# Patient Record
Sex: Male | Born: 2000 | Race: White | Hispanic: Yes | Marital: Single | Smoking: Never smoker
Health system: Southern US, Community
[De-identification: ages and names within clinical notes are randomized; demographics above are authoritative.]

---
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
## 2019-07-30 ENCOUNTER — Other Ambulatory Visit: Payer: Self-pay

## 2019-07-30 DIAGNOSIS — Z20822 Contact with and (suspected) exposure to covid-19: Secondary | ICD-10-CM

## 2019-08-03 LAB — NOVEL CORONAVIRUS, NAA: SARS-CoV-2, NAA: NOT DETECTED

## 2021-04-13 ENCOUNTER — Emergency Department (HOSPITAL_COMMUNITY): Admission: EM | Admit: 2021-04-13 | Discharge: 2021-04-13 | Discharge status: Against Medical Advice | Payer: Self-pay

## 2021-04-13 NOTE — ED Notes (Signed)
Called for triage, no answer

## 2023-10-04 ENCOUNTER — Emergency Department (HOSPITAL_BASED_OUTPATIENT_CLINIC_OR_DEPARTMENT_OTHER): Payer: Medicaid Other

## 2023-10-04 ENCOUNTER — Encounter (HOSPITAL_BASED_OUTPATIENT_CLINIC_OR_DEPARTMENT_OTHER): Payer: Self-pay | Admitting: Emergency Medicine

## 2023-10-04 ENCOUNTER — Emergency Department (HOSPITAL_BASED_OUTPATIENT_CLINIC_OR_DEPARTMENT_OTHER)
Admission: EM | Admit: 2023-10-04 | Discharge: 2023-10-04 | Disposition: A | Discharge status: Home / Self Care | Payer: Medicaid Other | Attending: Emergency Medicine | Admitting: Emergency Medicine

## 2023-10-04 ENCOUNTER — Other Ambulatory Visit: Payer: Self-pay

## 2023-10-04 DIAGNOSIS — Z0000 Encounter for general adult medical examination without abnormal findings: Secondary | ICD-10-CM | POA: Diagnosis not present

## 2023-10-04 DIAGNOSIS — N50819 Testicular pain, unspecified: Secondary | ICD-10-CM | POA: Diagnosis present

## 2023-10-04 DIAGNOSIS — N503 Cyst of epididymis: Secondary | ICD-10-CM | POA: Insufficient documentation

## 2023-10-04 LAB — CBC WITH DIFFERENTIAL/PLATELET
Abs Immature Granulocytes: 0.02 10*3/uL (ref 0.00–0.07)
Basophils Absolute: 0 10*3/uL (ref 0.0–0.1)
Basophils Relative: 1 %
Eosinophils Absolute: 0.1 10*3/uL (ref 0.0–0.5)
Eosinophils Relative: 1 %
HCT: 48 % (ref 39.0–52.0)
Hemoglobin: 16.1 g/dL (ref 13.0–17.0)
Immature Granulocytes: 0 %
Lymphocytes Relative: 34 %
Lymphs Abs: 1.9 10*3/uL (ref 0.7–4.0)
MCH: 30 pg (ref 26.0–34.0)
MCHC: 33.5 g/dL (ref 30.0–36.0)
MCV: 89.4 fL (ref 80.0–100.0)
Monocytes Absolute: 0.7 10*3/uL (ref 0.1–1.0)
Monocytes Relative: 12 %
Neutro Abs: 3 10*3/uL (ref 1.7–7.7)
Neutrophils Relative %: 52 %
Platelets: 208 10*3/uL (ref 150–400)
RBC: 5.37 MIL/uL (ref 4.22–5.81)
RDW: 12.4 % (ref 11.5–15.5)
WBC: 5.7 10*3/uL (ref 4.0–10.5)
nRBC: 0 % (ref 0.0–0.2)

## 2023-10-04 LAB — COMPREHENSIVE METABOLIC PANEL
ALT: 98 U/L — ABNORMAL HIGH (ref 0–44)
AST: 48 U/L — ABNORMAL HIGH (ref 15–41)
Albumin: 4.5 g/dL (ref 3.5–5.0)
Alkaline Phosphatase: 80 U/L (ref 38–126)
Anion gap: 9 (ref 5–15)
BUN: 10 mg/dL (ref 6–20)
CO2: 28 mmol/L (ref 22–32)
Calcium: 9.6 mg/dL (ref 8.9–10.3)
Chloride: 103 mmol/L (ref 98–111)
Creatinine, Ser: 0.81 mg/dL (ref 0.61–1.24)
GFR, Estimated: >60 mL/min (ref >60)
Glucose, Bld: 100 mg/dL — ABNORMAL HIGH (ref 70–99)
Potassium: 3.5 mmol/L (ref 3.5–5.1)
Sodium: 140 mmol/L (ref 135–145)
Total Bilirubin: 0.4 mg/dL (ref 0.0–1.2)
Total Protein: 7.8 g/dL (ref 6.5–8.1)

## 2023-10-04 LAB — URINALYSIS, W/ REFLEX TO CULTURE (INFECTION SUSPECTED)
Bacteria, UA: NONE SEEN
Bilirubin Urine: NEGATIVE
Glucose, UA: NEGATIVE mg/dL
Hgb urine dipstick: NEGATIVE
Ketones, ur: NEGATIVE mg/dL
Leukocytes,Ua: NEGATIVE
Nitrite: NEGATIVE
Protein, ur: NEGATIVE mg/dL
Specific Gravity, Urine: 1.024 (ref 1.005–1.030)
pH: 6 (ref 5.0–8.0)

## 2023-10-04 LAB — HIV ANTIBODY (ROUTINE TESTING W REFLEX): HIV Screen 4th Generation wRfx: NONREACTIVE

## 2023-10-04 MED ORDER — CEFTRIAXONE SODIUM 500 MG IJ SOLR
500.0000 mg | Freq: Once | INTRAMUSCULAR | Status: AC
  Administered 2023-10-04: 500 mg via INTRAMUSCULAR
  Filled 2023-10-04: qty 500

## 2023-10-04 MED ORDER — AZITHROMYCIN 1 G PO PACK
1.0000 g | PACK | Freq: Once | ORAL | Status: AC
Start: 2023-10-04 — End: 2023-10-04
  Administered 2023-10-04: 1 g via ORAL
  Filled 2023-10-04: qty 1

## 2023-10-04 NOTE — Discharge Instructions (Signed)
While you are in the emergency room, you were treated for gonorrhea and chlamydia.  This does not mean that you tested positive for those diseases, but just that it is usually safer to treat people with an exposure.  You can check your MyChart for your HIV and syphilis test.  If those are positive, you will be contacted.   Looking at your labs, there is some mild elevation in your liver function.  Please follow-up with your PCP to have those rechecked.  If you do not have a primary care doctor, you can find information in your discharge paperwork to set up care with 1.

## 2023-10-04 NOTE — ED Notes (Signed)
RN reviewed discharge instructions with pt. Pt verbalized understanding and had no further questions. VSS upon discharge.

## 2023-10-04 NOTE — ED Triage Notes (Signed)
C/o testicular pain x 3 days. Reports "bumps" on outside of testicle. Reports recent unprotected sex.

## 2023-10-04 NOTE — ED Provider Triage Note (Signed)
Emergency Medicine Provider Triage Evaluation Note  Joe Spencer , a 23 y.o. male  was evaluated in triage.  Pt complains of testicle pain.  Review of Systems  Positive:  Negative:   Physical Exam  BP 134/70 (BP Location: Left Arm)   Pulse 74   Temp 98.1 F (36.7 C) (Oral)   Resp 18   Ht 5\' 7"  (1.702 m)   Wt 86.2 kg   SpO2 99%   BMI 29.76 kg/m  Gen:   Awake, no distress   Resp:  Normal effort  MSK:   Moves extremities without difficulty  Other:    Medical Decision Making  Medically screening exam initiated at 12:07 PM.  Appropriate orders placed.  Joe Spencer was informed that the remainder of the evaluation will be completed by another provider, this initial triage assessment does not replace that evaluation, and the importance of remaining in the ED until their evaluation is complete.  Intermittent testicular pain x2 days. Intercourse made it worse. Pain includes both testicles. Feels like pinching. No dysuria, hematuria, penile discharge, genital lesions.     Dorthy Cooler, [place] 10/04/23 1208

## 2023-10-04 NOTE — ED Provider Notes (Signed)
[place] EMERGENCY DEPARTMENT AT [facility] Provider Note   CSN: [id number] Arrival date & time: 10/04/23  1135     History  Chief Complaint  Patient presents with   Testicle Pain    Joe Spencer is a 23 y.o. male.  23 year old male here today for testicular pain, however he is primarily concerned about recently having unprotected sex.  Denies any dysuria, pain with urination.   Testicle Pain       Home Medications Prior to Admission medications   Medication Sig Start Date End Date Taking? Authorizing Provider  acetaminophen-codeine (TYLENOL #3) 300-30 MG per tablet Take 1 tablet by mouth every 6 (six) hours as needed for pain. 02/02/13   Mabe, Latanya Maudlin, MD  albuterol (PROVENTIL HFA;VENTOLIN HFA) 108 (90 BASE) MCG/ACT inhaler Inhale 2 puffs into the lungs every 6 (six) hours as needed.    [provider]  metroNIDAZOLE (FLAGYL) 500 MG tablet Take 1 tablet (500 mg total) by mouth 2 (two) times daily. 01/20/17   Derwood Kaplan, MD  triamcinolone (KENALOG) 0.025 % ointment Apply 1 application topically 2 (two) times daily. 07/24/15   Viviano Simas, NP      Allergies    Patient has no known allergies.    Review of Systems   Review of Systems  Genitourinary:  Positive for testicular pain.    Physical Exam Updated Vital Signs BP 129/64 (BP Location: Left Arm)   Pulse 65   Temp 98.3 F (36.8 C) (Oral)   Resp 16   Ht 5\' 7"  (1.702 m)   Wt 86.2 kg   SpO2 98%   BMI 29.76 kg/m  Physical Exam Vitals reviewed.  Cardiovascular:     Rate and Rhythm: Normal rate.  Genitourinary:    Penis: Normal.      Testes: Normal.  Skin:    Findings: No rash.  Neurological:     Mental Status: He is alert.     ED Results / Procedures / Treatments   Labs (all labs ordered are listed, but only abnormal results are displayed) Labs Reviewed  COMPREHENSIVE METABOLIC PANEL - Abnormal; Notable for the following components:      Result Value   Glucose, Bld  100 (*)    AST 48 (*)    ALT 98 (*)    All other components within normal limits  WET PREP, GENITAL  URINALYSIS, W/ REFLEX TO CULTURE (INFECTION SUSPECTED)  CBC WITH DIFFERENTIAL/PLATELET  RPR  HIV ANTIBODY (ROUTINE TESTING W REFLEX)  GC/CHLAMYDIA PROBE AMP ([place]) NOT AT [facility]    EKG None  Radiology US SCROTUM W/DOPPLER Result Date: 10/04/2023 CLINICAL DATA:  Testicle pain EXAM: SCROTAL ULTRASOUND DOPPLER ULTRASOUND OF THE TESTICLES TECHNIQUE: Complete ultrasound examination of the testicles, epididymis, and other scrotal structures was performed. Color and spectral Doppler ultrasound were also utilized to evaluate blood flow to the testicles. COMPARISON:  None Available. FINDINGS: Right testicle Measurements: 3.6 x 1.7 x 2.5 cm. No mass or microlithiasis visualized. Left testicle Measurements: 3.4 x 2 x 2.7 cm. No mass or microlithiasis visualized. Right epididymis:  Tiny cyst measuring 3 mm Left epididymis:  Normal in size and appearance. Hydrocele:  None visualized. Varicocele:  None visualized. Pulsed Doppler interrogation of both testes demonstrates normal low resistance arterial and venous waveforms bilaterally. IMPRESSION: Negative for testicular torsion or mass. Tiny right epididymal cyst. Electronically Signed   By: Jasmine Pang M.D.   On: 10/04/2023 15:20    Procedures Procedures    Medications Ordered in  ED Medications  cefTRIAXone (ROCEPHIN) injection 500 mg (has no administration in time range)  azithromycin (ZITHROMAX) powder 1 g (has no administration in time range)    ED Course/ Medical Decision Making/ A&P                                 Medical Decision Making 23 year old male here today with testicular pain, concerns about unprotected sex.  Plan-on exam, patient without any signs or evidence of torsion.  Normal external genitalia, no rashes, no lymphadenopathy.  Ultrasound imaging shows a small right-sided epididymal cyst, otherwise normal.  Speak with  the patient, seems though he is more concerned about having unprotected sex recently.  Has not had any symptoms of STI.  STI testing ordered, advised patient to follow along and MyChart, that we will contact him if test results are positive.  Will empirically treat patient for gonorrhea chlamydia.  Reviewed the patient's labs, no leukocytosis, mild elevation in LFTs.  Provide him information to follow-up with the PCP.  This patient's health care is complicated by the following social determinants of health-lack of access to primary care.  Risk Prescription drug management.           Final Clinical Impression(s) / ED Diagnoses Final diagnoses:  General medical exam    Rx / DC Orders ED Discharge Orders     None         Arletha Pili, DO 10/04/23 1721

## 2023-10-05 LAB — RPR: RPR Ser Ql: NONREACTIVE

## 2023-10-06 LAB — GC/CHLAMYDIA PROBE AMP (~~LOC~~) NOT AT ARMC
Chlamydia: NEGATIVE
Comment: NEGATIVE
Comment: NORMAL
Neisseria Gonorrhea: NEGATIVE
